# Patient Record
Sex: Female | Born: 1980 | Race: Black or African American | Hispanic: No | Marital: Single | Smoking: Never smoker
Health system: Southern US, Community
[De-identification: ages and names within clinical notes are randomized; demographics above are authoritative.]

## PROBLEM LIST (undated history)

## (undated) DIAGNOSIS — D219 Benign neoplasm of connective and other soft tissue, unspecified: Secondary | ICD-10-CM

---

## 2003-10-12 ENCOUNTER — Emergency Department (HOSPITAL_COMMUNITY): Admission: EM | Admit: 2003-10-12 | Discharge: 2003-10-12 | Payer: Self-pay | Admitting: Emergency Medicine

## 2004-06-07 ENCOUNTER — Emergency Department (HOSPITAL_COMMUNITY): Admission: EM | Admit: 2004-06-07 | Discharge: 2004-06-08 | Payer: Self-pay | Admitting: Emergency Medicine

## 2005-04-14 ENCOUNTER — Emergency Department (HOSPITAL_COMMUNITY): Admission: EM | Admit: 2005-04-14 | Discharge: 2005-04-14 | Payer: Self-pay | Admitting: Emergency Medicine

## 2006-02-20 ENCOUNTER — Emergency Department (HOSPITAL_COMMUNITY): Admission: EM | Admit: 2006-02-20 | Discharge: 2006-02-20 | Payer: Self-pay | Admitting: Emergency Medicine

## 2007-12-28 ENCOUNTER — Emergency Department (HOSPITAL_COMMUNITY): Admission: EM | Admit: 2007-12-28 | Discharge: 2007-12-28 | Payer: Self-pay | Admitting: Emergency Medicine

## 2009-10-07 ENCOUNTER — Emergency Department (HOSPITAL_COMMUNITY): Admission: EM | Admit: 2009-10-07 | Discharge: 2009-10-07 | Payer: Self-pay | Admitting: Emergency Medicine

## 2011-01-21 ENCOUNTER — Other Ambulatory Visit: Payer: Self-pay | Admitting: Family Medicine

## 2011-01-21 DIAGNOSIS — N6320 Unspecified lump in the left breast, unspecified quadrant: Secondary | ICD-10-CM

## 2011-01-29 ENCOUNTER — Other Ambulatory Visit: Payer: Self-pay | Admitting: Diagnostic Radiology

## 2011-01-29 ENCOUNTER — Other Ambulatory Visit: Payer: Self-pay | Admitting: Family Medicine

## 2011-01-29 ENCOUNTER — Ambulatory Visit
Admission: RE | Admit: 2011-01-29 | Discharge: 2011-01-29 | Disposition: A | Discharge status: Home / Self Care | Payer: Self-pay | Source: Ambulatory Visit | Attending: Family Medicine | Admitting: Family Medicine

## 2011-01-29 DIAGNOSIS — N6320 Unspecified lump in the left breast, unspecified quadrant: Secondary | ICD-10-CM

## 2011-05-27 LAB — CBC
Hemoglobin: 9.5 — ABNORMAL LOW
MCHC: 31.4
MCV: 71.1 — ABNORMAL LOW
Platelets: 340
RDW: 17.4 — ABNORMAL HIGH
WBC: 5.4

## 2011-05-27 LAB — URINALYSIS, ROUTINE W REFLEX MICROSCOPIC
Glucose, UA: NEGATIVE
Ketones, ur: NEGATIVE
Protein, ur: NEGATIVE
pH: 8

## 2011-05-27 LAB — POCT I-STAT, CHEM 8
Calcium, Ion: 1.05 — ABNORMAL LOW
HCT: 32 — ABNORMAL LOW
Hemoglobin: 10.9 — ABNORMAL LOW
Potassium: 3.9
TCO2: 23

## 2011-05-27 LAB — DIFFERENTIAL
Basophils Relative: 1
Lymphocytes Relative: 15
Monocytes Relative: 12

## 2011-06-02 ENCOUNTER — Emergency Department (HOSPITAL_COMMUNITY)
Admission: EM | Admit: 2011-06-02 | Discharge: 2011-06-03 | Disposition: A | Discharge status: Home / Self Care | Payer: Self-pay | Attending: Emergency Medicine | Admitting: Emergency Medicine

## 2011-06-02 DIAGNOSIS — R112 Nausea with vomiting, unspecified: Secondary | ICD-10-CM | POA: Insufficient documentation

## 2011-06-02 DIAGNOSIS — N949 Unspecified condition associated with female genital organs and menstrual cycle: Secondary | ICD-10-CM | POA: Insufficient documentation

## 2011-06-02 DIAGNOSIS — D259 Leiomyoma of uterus, unspecified: Secondary | ICD-10-CM | POA: Insufficient documentation

## 2011-06-03 ENCOUNTER — Emergency Department (HOSPITAL_COMMUNITY): Payer: Self-pay

## 2011-06-03 LAB — POCT PREGNANCY, URINE: Preg Test, Ur: NEGATIVE

## 2011-06-03 LAB — URINALYSIS, ROUTINE W REFLEX MICROSCOPIC
Bilirubin Urine: NEGATIVE
Ketones, ur: 40 mg/dL — AB
Protein, ur: 30 mg/dL — AB
pH: 5.5 (ref 5.0–8.0)

## 2011-06-03 LAB — URINE MICROSCOPIC-ADD ON

## 2011-06-03 LAB — COMPREHENSIVE METABOLIC PANEL
Alkaline Phosphatase: 50 U/L (ref 39–117)
CO2: 27 mEq/L (ref 19–32)
Calcium: 9.7 mg/dL (ref 8.4–10.5)
Chloride: 101 mEq/L (ref 96–112)
Creatinine, Ser: <0.47 mg/dL — ABNORMAL LOW (ref 0.50–1.10)
Glucose, Bld: 92 mg/dL (ref 70–99)
Sodium: 136 mEq/L (ref 135–145)

## 2011-06-03 LAB — WET PREP, GENITAL: Clue Cells Wet Prep HPF POC: NONE SEEN

## 2011-06-03 LAB — DIFFERENTIAL
Basophils Relative: 1 % (ref 0–1)
Eosinophils Absolute: 0 10*3/uL (ref 0.0–0.7)
Neutro Abs: 11 10*3/uL — ABNORMAL HIGH (ref 1.7–7.7)

## 2011-06-03 LAB — CBC
HCT: 28.7 % — ABNORMAL LOW (ref 36.0–46.0)
Hemoglobin: 8.3 g/dL — ABNORMAL LOW (ref 12.0–15.0)
RBC: 4.58 MIL/uL (ref 3.87–5.11)
RDW: 18.1 % — ABNORMAL HIGH (ref 11.5–15.5)
WBC: 12.8 10*3/uL — ABNORMAL HIGH (ref 4.0–10.5)

## 2011-06-05 ENCOUNTER — Encounter: Payer: Self-pay | Admitting: Advanced Practice Midwife

## 2011-07-11 ENCOUNTER — Ambulatory Visit (INDEPENDENT_AMBULATORY_CARE_PROVIDER_SITE_OTHER): Payer: Self-pay | Admitting: Advanced Practice Midwife

## 2011-07-11 VITALS — BP 109/61 | HR 80 | Ht 59.0 in | Wt 91.2 lb

## 2011-07-11 DIAGNOSIS — D500 Iron deficiency anemia secondary to blood loss (chronic): Secondary | ICD-10-CM | POA: Insufficient documentation

## 2011-07-11 DIAGNOSIS — D219 Benign neoplasm of connective and other soft tissue, unspecified: Secondary | ICD-10-CM | POA: Insufficient documentation

## 2011-07-11 DIAGNOSIS — D259 Leiomyoma of uterus, unspecified: Secondary | ICD-10-CM

## 2011-07-11 NOTE — Patient Instructions (Signed)
Fibroids You have been diagnosed as having a fibroid. Fibroids are smooth muscle lumps (tumors) which can occur any place in a woman's body. They are usually in the womb (uterus). The most common problem (symptom) of fibroids is bleeding. Over time this may cause low red blood cells (anemia). Other symptoms include feelings of pressure and pain in the pelvis. The diagnosis (learning what is wrong) of fibroids is made by physical exam. Sometimes tests such as an ultrasound are used. This is helpful when fibroids are felt around the ovaries and to look for tumors. TREATMENT   Most fibroids do not need surgical or medical treatment. Sometimes a tissue sample (biopsy) of the lining of the uterus is done to rule out cancer. If there is no cancer and only a small amount of bleeding, the problem can be watched.   Hormonal treatment can improve the problem.   When surgery is needed, it can consist of removing the fibroid. Vaginal birth may not be possible after the removal of fibroids. This depends on where they are and the extent of surgery. When pregnancy occurs with fibroids it is usually normal.   Your caregiver can help decide which treatments are best for you.  HOME CARE INSTRUCTIONS   Do not use aspirin as this may increase bleeding problems.   If your periods (menses) are heavy, record the number of pads or tampons used per month. Bring this information to your caregiver. This can help them determine the best treatment for you.  SEEK IMMEDIATE MEDICAL CARE IF:  You have pelvic pain or cramps not controlled with medications, or experience a sudden increase in pain.   You have an increase of pelvic bleeding between and during menses.   You feel lightheaded or have fainting spells.   You develop worsening belly (abdominal) pain.  Document Released: 08/15/2000 Document Revised: 04/30/2011 Document Reviewed: 04/06/2008 [employer] Patient Information 2012 [employer]. 

## 2011-07-11 NOTE — Assessment & Plan Note (Signed)
Fibroids. Causing menometrorrhagia and anemia.  Last cbc 8.3 Currently on Depo with controlled periods. Patient main concern is fertility. Explained risks of surgery on fertility and uterine integrity. Patient wants to talk to [facility] surgeon about Fibroid removal/ablation.

## 2011-07-11 NOTE — Progress Notes (Signed)
  Subjective:    Patient ID: Yesenia Holmes, female    DOB: [birth year], 30 y.o.   MRN: [id number]  HPI 1. Fibroids/Prolonged vaginal bleeding during menses/worry for fertility with fibroids. Patient is a 30 y/o aaf with fibroids diagnosed on U/S one month ago at [facility]. Also seen at [facility] department and noted to have an enlarged uterus on bimanual. Patient c/o worry about infertility with fibroids. She was placed on Depo for heavy prolonged menses causing blood loss anemia.  Last cbc oct 1st:    Component Value Date/Time   WBC 12.8* 06/02/2011 2324   HGB 8.3* 06/02/2011 2324   HCT 28.7* 06/02/2011 2324   PLT 322 06/02/2011 2324   MCV 62.7* 06/02/2011 2324   NEUTROABS 11.0* 06/02/2011 2324   LYMPHSABS 0.8 06/02/2011 2324   MONOABS 0.8 06/02/2011 2324   EOSABS 0.0 06/02/2011 2324   BASOSABS 0.1 06/02/2011 2324  Patient reports having shorter less severe periods on Depo.   U/S Oct 2nd:  *RADIOLOGY REPORT*  Clinical Data: Pelvic pain  TRANSABDOMINAL AND TRANSVAGINAL ULTRASOUND OF PELVIS  Technique: Both transabdominal and transvaginal ultrasound  examinations of the pelvis were performed. Transabdominal technique  was performed for global imaging of the pelvis including uterus,  ovaries, adnexal regions, and pelvic cul-de-sac.  Comparison: None.  It was necessary to proceed with endovaginal exam following the  transabdominal exam to visualize the uterus, endometrium, and  cervix.  Findings:  Uterus: The uterus measures 9.6 x 4.5 x 6.3 cm. Heterogeneous  nodular myometrial echotexture consistent with uterine fibroids.  The largest is measured at about 3.8 x 3.6 x 3.9 cm.  Endometrium: The endometrium is displaced by the uterine fibroids  and is not visualized.  Right ovary: The right ovary measures 3.2 x 2.0 x 1.4 cm and  contains normal follicular changes. No abnormal adnexal masses.  Left ovary: The left ovary measures 2.8 x 1.6 x 2.0 cm. Normal  follicular changes. No abnormal  adnexal masses.  Other findings: Small amount of free fluid in the cul-de-sac.  IMPRESSION:  Multiple diffuse myometrial masses measuring up to about 3.9 cm  maximal diameter, consistent with uterine fibroids. Due to the  fibroids, the endometrium is not visualized. Normal appearance of  the ovaries. Small amount of free fluid, likely physiologic.  Review of Systems No fever, weight loss, chills, night sweats. No dysuria, vaginal discharge, no flank pain.    Objective:   Physical Exam  Abdominal: Soft. She exhibits no distension and no mass. There is no tenderness.  Genitourinary: There is no rash or tenderness on the right labia. There is no rash or tenderness on the left labia. Uterus is not tender. Cervix exhibits no motion tenderness. Right adnexum displays no mass, no tenderness and no fullness. Left adnexum displays no mass, no tenderness and no fullness. There is bleeding around the vagina. No erythema or tenderness around the vagina. No vaginal discharge found.       Uterus 8 week size with marble like fibroids palpated.   Filed Vitals:   07/11/11 0918  BP: 109/61  Pulse: 80  Height: 4\' 11"  (1.499 m)  Weight: 91 lb 3.2 oz (41.368 kg)      Assessment & Plan:  Fibroids. Causing menometrorrhagia and anemia.  Last cbc 8.3 Currently on Depo with controlled periods. Patient main concern is fertility. Explained risks of surgery on fertility and uterine integrity. Patient wants to talk to [facility] surgeon about Fibroid removal/ablation.

## 2011-08-28 ENCOUNTER — Ambulatory Visit: Payer: Self-pay | Admitting: Obstetrics and Gynecology

## 2012-06-13 IMAGING — US US TRANSVAGINAL NON-OB
1 series · 13 of 25 positions shown · non-contrast
Comparison: None.

CLINICAL DATA: Pelvic pain

TRANSABDOMINAL AND TRANSVAGINAL ULTRASOUND OF PELVIS
TECHNIQUE: Both transabdominal and transvaginal ultrasound
examinations of the pelvis were performed. Transabdominal technique
was performed for global imaging of the pelvis including uterus,
ovaries, adnexal regions, and pelvic cul-de-sac.

[Series 1: us transvaginal non-ob · 0.26mm/px · 13 of 39 slices shown]
[im 1/39]
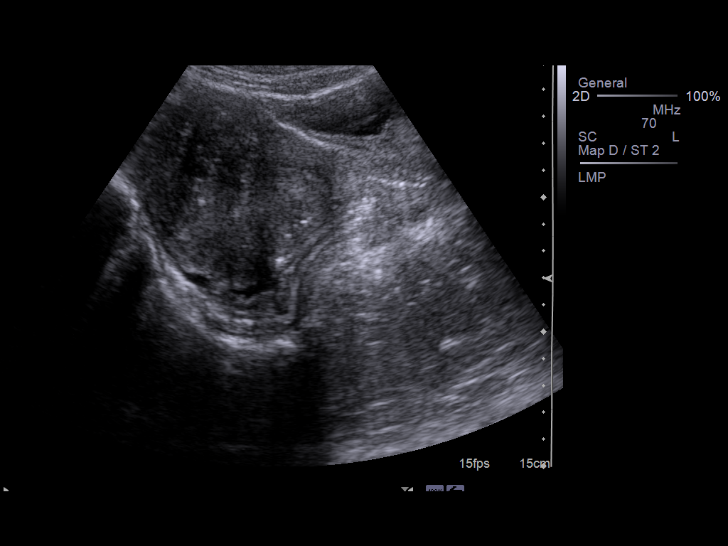
[im 4/39]
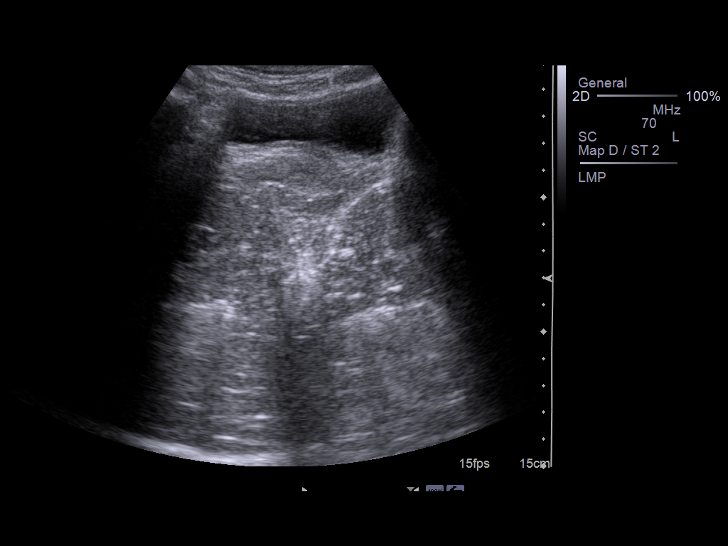
[im 7/39]
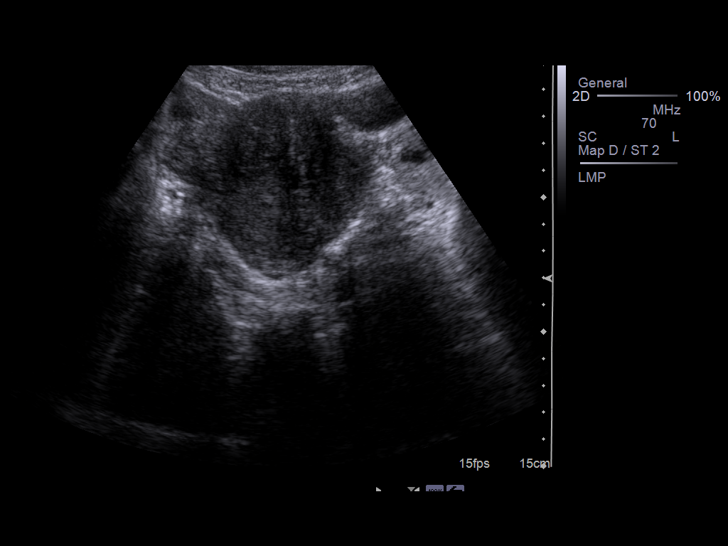
[im 10/39]
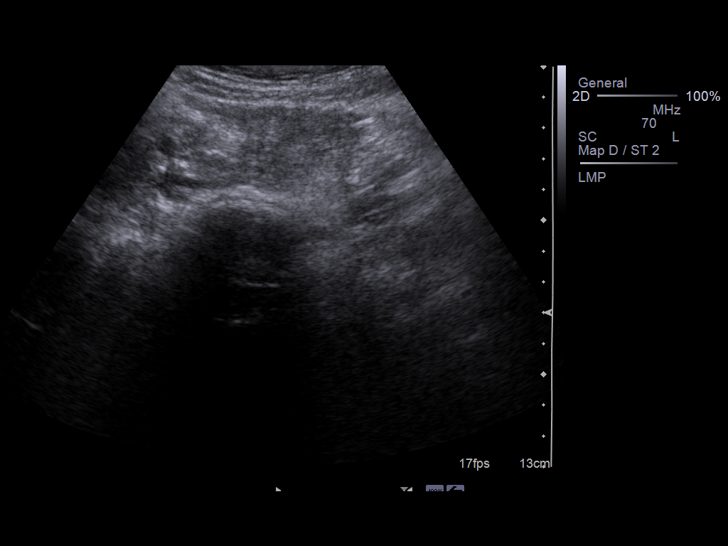
[im 13/39]
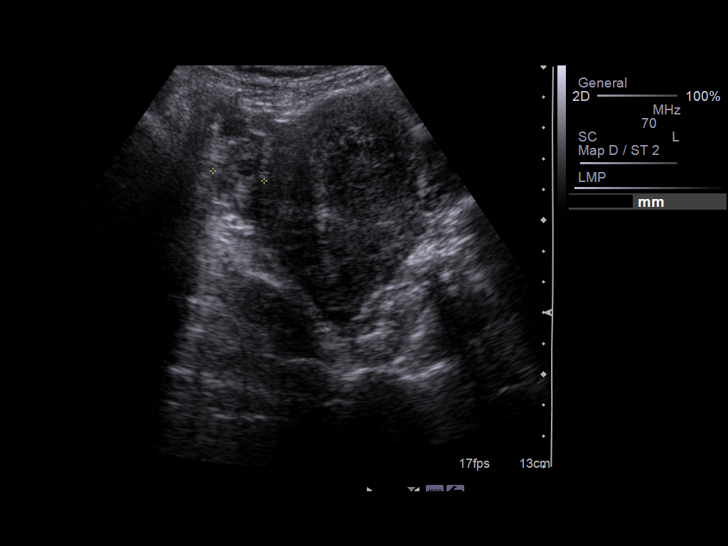
[im 16/39]
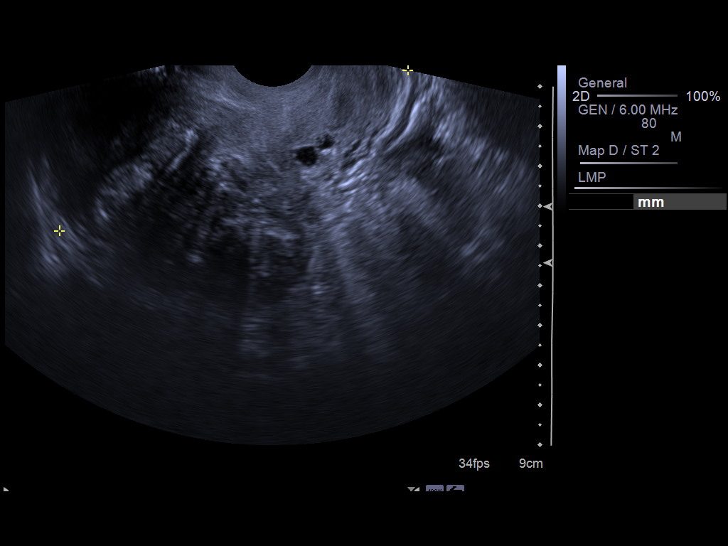
[im 20/39]
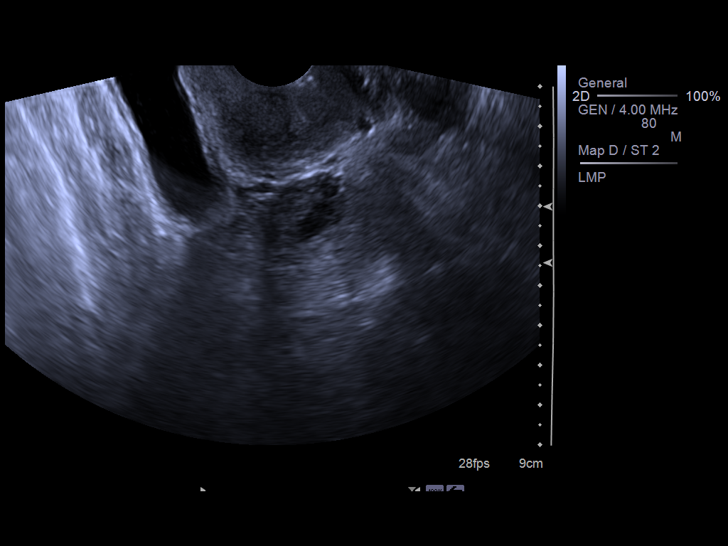
[im 23/39]
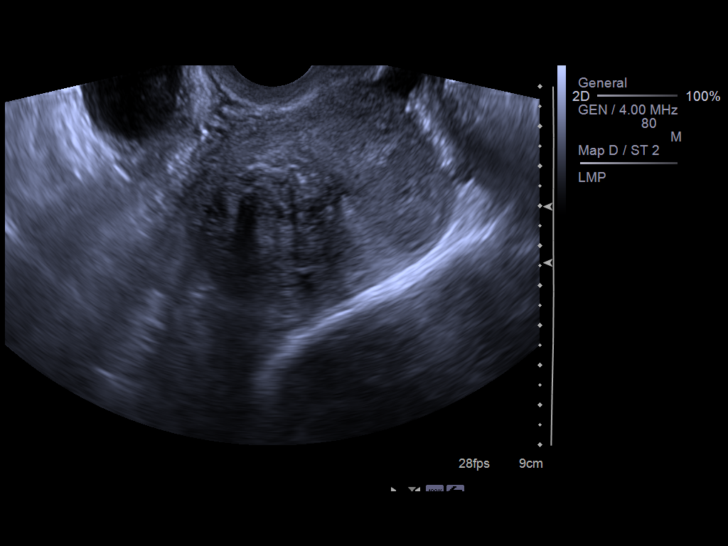
[im 26/39]
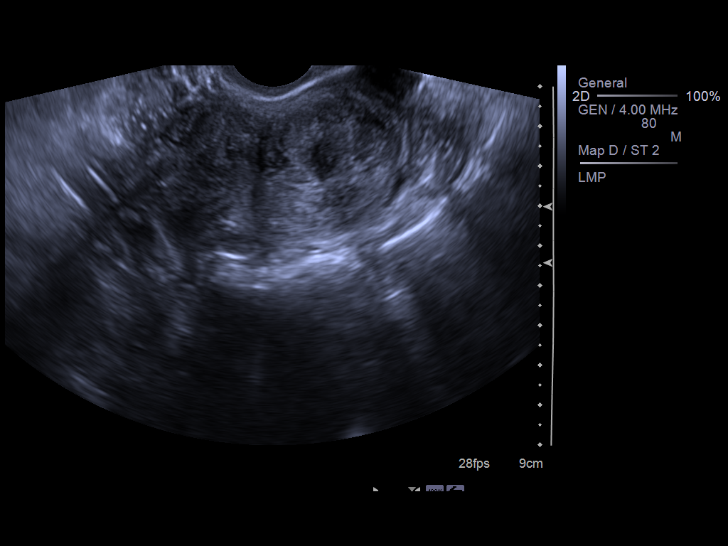
[im 29/39]
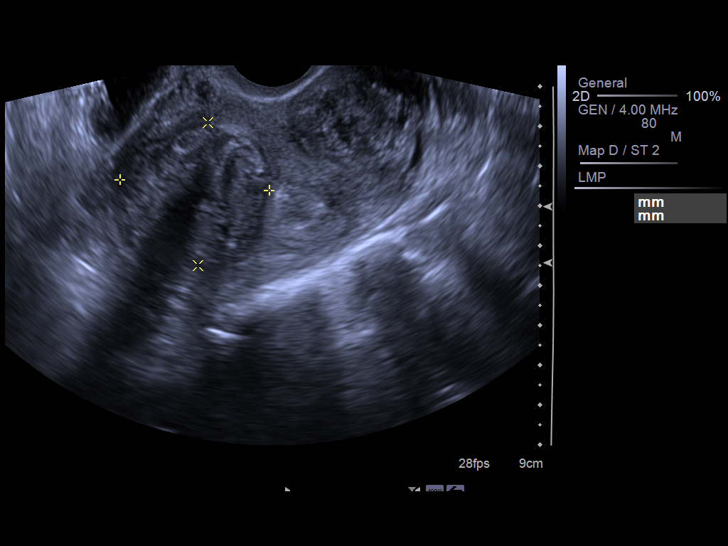
[im 32/39]
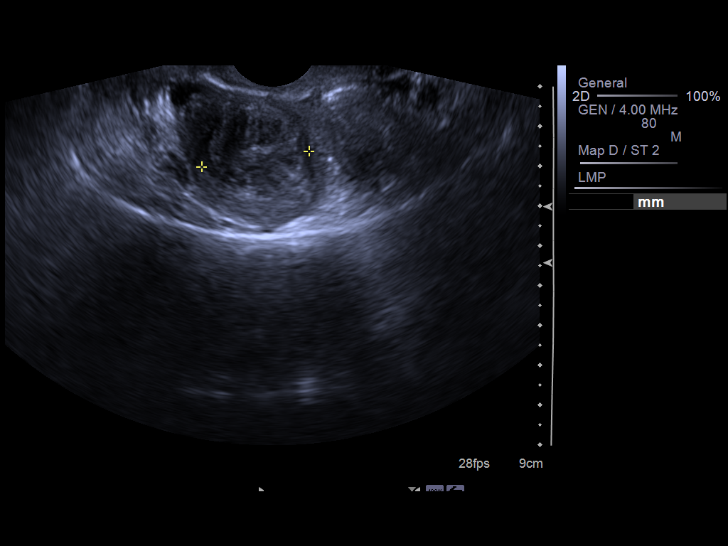
[im 35/39]
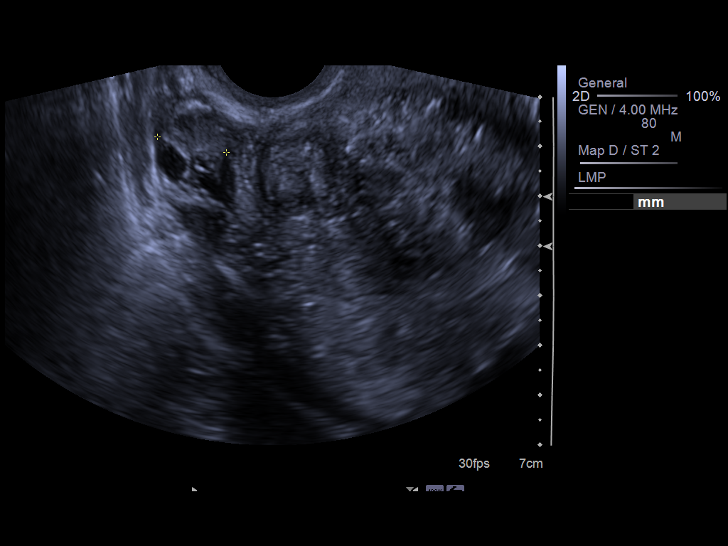
[im 39/39]
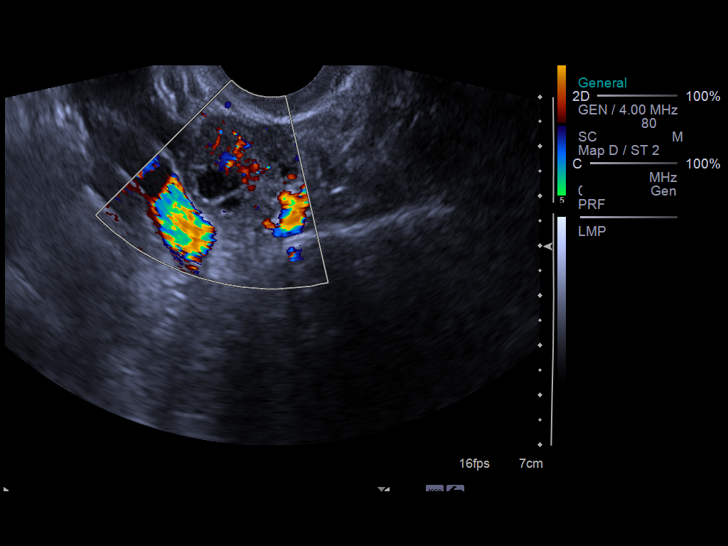

[13 of 25 positions shown; findings below may reference images not displayed]

It was necessary to proceed with endovaginal exam following the
transabdominal exam to visualize the uterus, endometrium, and
cervix.
FINDINGS: Uterus: The uterus measures 9.6 x 4.5 x 6.3 cm.  Heterogeneous
nodular myometrial echotexture consistent with uterine fibroids.
The largest is measured at about 3.8 x 3.6 x 3.9 cm.

Endometrium: The endometrium is displaced by the uterine fibroids
and is not visualized.

Right ovary:  The right ovary measures 3.2 x 2.0 x 1.4 cm and
contains normal follicular changes.  No abnormal adnexal masses.

Left ovary: The left ovary measures 2.8 x 1.6 x 2.0 cm.  Normal
follicular changes.  No abnormal adnexal masses.

Other findings: Small amount of free fluid in the cul-de-sac.
IMPRESSION: Multiple diffuse myometrial masses measuring up to about 3.9 cm
maximal diameter, consistent with uterine fibroids.  Due to the
fibroids, the endometrium is not visualized.  Normal appearance of
the ovaries.  Small amount of free fluid, likely physiologic.

## 2012-09-27 ENCOUNTER — Encounter: Payer: Self-pay | Admitting: Obstetrics & Gynecology

## 2012-09-27 ENCOUNTER — Ambulatory Visit (INDEPENDENT_AMBULATORY_CARE_PROVIDER_SITE_OTHER): Payer: Self-pay | Admitting: Obstetrics & Gynecology

## 2012-09-27 VITALS — BP 108/67 | HR 79 | Temp 98.9°F | Ht 60.0 in | Wt 92.1 lb

## 2012-09-27 DIAGNOSIS — D259 Leiomyoma of uterus, unspecified: Secondary | ICD-10-CM

## 2012-09-27 DIAGNOSIS — N920 Excessive and frequent menstruation with regular cycle: Secondary | ICD-10-CM

## 2012-09-27 DIAGNOSIS — D219 Benign neoplasm of connective and other soft tissue, unspecified: Secondary | ICD-10-CM

## 2012-09-27 NOTE — Progress Notes (Signed)
  Subjective:    Patient ID: Yesenia Holmes, female    DOB: [birth year], 32 y.o.   MRN: [id number]  HPI 32 yo S AA G1P0A1 with known fibroids with a hbg of 8.3. She says that her periods lasts 20 days per month. She got a depo provera injection 11/13 but says that this did not help at all.  Review of Systems She declines a Gardasil and flu vaccine.    Objective:   Physical Exam        Assessment & Plan:  Fibroids and anemia- I will check a TSH. I have discussed a hysterectomy as well as trying a Mirena IUD. She would like to try the Mirena and will fill out the application for the free Mirena.  Rec iron BID.

## 2012-09-28 LAB — TSH: TSH: 0.856 u[IU]/mL (ref 0.350–4.500)

## 2012-10-18 ENCOUNTER — Encounter: Payer: Self-pay | Admitting: Medical

## 2012-10-18 ENCOUNTER — Other Ambulatory Visit: Payer: Self-pay | Admitting: Medical

## 2012-10-18 ENCOUNTER — Ambulatory Visit (INDEPENDENT_AMBULATORY_CARE_PROVIDER_SITE_OTHER): Payer: Self-pay | Admitting: Medical

## 2012-10-18 VITALS — BP 109/64 | Temp 97.0°F | Ht 60.0 in | Wt 92.4 lb

## 2012-10-18 DIAGNOSIS — Z975 Presence of (intrauterine) contraceptive device: Secondary | ICD-10-CM

## 2012-10-18 DIAGNOSIS — N920 Excessive and frequent menstruation with regular cycle: Secondary | ICD-10-CM

## 2012-10-18 DIAGNOSIS — Z30430 Encounter for insertion of intrauterine contraceptive device: Secondary | ICD-10-CM

## 2012-10-18 MED ORDER — LEVONORGESTREL 20 MCG/24HR IU IUD
INTRAUTERINE_SYSTEM | Freq: Once | INTRAUTERINE | Status: AC
  Administered 2012-10-18: 1 via INTRAUTERINE

## 2012-10-18 NOTE — Progress Notes (Signed)
Patient ID: Yesenia Holmes, female   DOB: 1981/03/13, 32 y.o.   MRN: [id number]  History:  Ms. Yesenia Holmes is a 32 y.o. G1P0010 who presents to clinic today for IUD insertion. The patient has known fibroids and menorrhagia. She has discussed all options for bleeding control at her last visit and decided on Mirena IUD.    The following portions of the patient's history were reviewed and updated as appropriate: allergies, current medications, past family history, past medical history, past social history, past surgical history and problem list.  Review of Systems:  Pertinent items are noted in HPI.  Objective:  Physical Exam BP 109/64  Temp(Src) 97 F (36.1 C) (Oral)  Ht 5' (1.524 m)  Wt 92 lb 6.4 oz (41.912 kg)  BMI 18.05 kg/m2  LMP 09/16/2012 GENERAL: Well-developed, well-nourished female in no acute distress.  HEENT: Normocephalic, atraumatic.   LUNGS: Normal rate. Clear to auscultation bilaterally.  HEART: Regular rate and rhythm with no adventitious sounds.  ABDOMEN: Soft, nontender, nondistended. No organomegaly. Normal bowel sounds appreciated in all quadrants.  PELVIC: Normal external female genitalia. Vagina is pink and rugated.  Normal discharge. Normal cervix contour. Uterus is normal in size. No adnexal mass or tenderness.  EXTREMITIES: No cyanosis, clubbing, or edema.  Labs and Imaging UPT - negative  IUD Insertion Procedure Note  Pre-operative Diagnosis: menorrhage  Post-operative Diagnosis: normal Indications: contraception, menorrhagia  Procedure Details  Urine pregnancy test was done and result was negative.  The risks (including infection, bleeding, pain, and uterine perforation) and benefits of the procedure were explained to the patient and Written informed consent was obtained.    Cervix cleansed with Betadine. Uterus sounded to 7 cm. Initial resistance met at the internal os. Used dilators #1 and #3 used to dilate cervix. IUD inserted without difficulty  after dilation. String visible and trimmed. Patient tolerated procedure well.  IUD Information: Mirena.  Condition: Stable  Complications: None  Plan:  The patient was advised to call for any fever or for prolonged or severe pain or bleeding. She was advised to use NSAID as needed for mild to moderate pain.   Assessment & Plan:  Assessment: IUD insertion  Plans: Post procedure care instructions given Patient may take NSAIDs for pain control today Patient will return to clinic in 4 weeks for a string check or sooner if needed

## 2012-10-18 NOTE — Patient Instructions (Addendum)
Intrauterine Device Insertion Care After Refer to this sheet in the next few weeks. These instructions provide you with information on caring for yourself after your procedure. Your caregiver may also give you more specific instructions. Your treatment has been planned according to current medical practices, but problems sometimes occur. Call your caregiver if you have any problems or questions after your procedure. HOME CARE INSTRUCTIONS   Only take over-the-counter or prescription medicines for pain, discomfort, or fever as directed by your caregiver. Do not use aspirin. This may increase bleeding.  Check your IUD to make sure it is in place before you resume sexual activity. You should be able to feel the strings. If you cannot feel the strings, something may be wrong. The IUD may have fallen out of the uterus, or the uterus may have been punctured (perforated) during placement. Also, if the strings are getting longer, it may mean that the IUD is being forced out of the uterus. You no longer have full protection from pregnancy if any of these problems occur.  You may resume sexual intercourse if you are not having problems with the IUD. The IUD is considered immediately effective.  You may resume normal activities.  Keep all follow-up appointments to be sure your IUD has remained in place. After the first exam, yearly exams are advised, unless you cannot feel the strings of your IUD.  Continue to check that the IUD is still in place by feeling for the strings after every menstrual period. SEEK MEDICAL CARE IF:   You have bleeding that is heavier or lasts longer than a normal menstrual cycle.  You have a fever.  You have increasing cramps or abdominal pain not relieved with medicine.  You have abdominal pain that does not seem to be related to the same area of earlier cramping and pain.  You are lightheaded, unusually weak, or faint.  You have abnormal vaginal discharge or  smells.  You have pain during sexual intercourse.  You cannot feel the IUD strings, or the IUD string has gotten longer.  You feel the IUD at the opening of the cervix in the vagina.  You think you are pregnant, or you miss your menstrual period.  The IUD string is hurting your sex partner. Document Released: 04/16/2011 Document Revised: 11/10/2011 Document Reviewed: 04/16/2011 [facility] Patient Information 2013 [place], [place]. Levonorgestrel intrauterine device (IUD) What is this medicine? LEVONORGESTREL IUD (LEE voe nor jes trel) is a contraceptive (birth control) device. It is used to prevent pregnancy and to treat heavy bleeding that occurs during your period. It can be used for up to 5 years. This medicine may be used for other purposes; ask your health care provider or pharmacist if you have questions. What should I tell my health care provider before I take this medicine? They need to know if you have any of these conditions: -abnormal Pap smear -cancer of the breast, uterus, or cervix -diabetes -endometritis -genital or pelvic infection now or in the past -have more than one sexual partner or your partner has more than one partner -heart disease -history of an ectopic or tubal pregnancy -immune system problems -IUD in place -liver disease or tumor -problems with blood clots or take blood-thinners -use intravenous drugs -uterus of unusual shape -vaginal bleeding that has not been explained -an unusual or allergic reaction to levonorgestrel, other hormones, silicone, or polyethylene, medicines, foods, dyes, or preservatives -pregnant or trying to get pregnant -breast-feeding How should I use this medicine? This device is  placed inside the uterus by a health care professional. Talk to your pediatrician regarding the use of this medicine in children. Special care may be needed. Overdosage: If you think you have taken too much of this medicine contact a poison control  center or emergency room at once. NOTE: This medicine is only for you. Do not share this medicine with others. What if I miss a dose? This does not apply. What may interact with this medicine? Do not take this medicine with any of the following medications: -amprenavir -bosentan -fosamprenavir This medicine may also interact with the following medications: -aprepitant -barbiturate medicines for inducing sleep or treating seizures -bexarotene -griseofulvin -medicines to treat seizures like carbamazepine, ethotoin, felbamate, oxcarbazepine, phenytoin, topiramate -modafinil -pioglitazone -rifabutin -rifampin -rifapentine -some medicines to treat HIV infection like atazanavir, indinavir, lopinavir, nelfinavir, tipranavir, ritonavir -St. John's wort -warfarin This list may not describe all possible interactions. Give your health care provider a list of all the medicines, herbs, non-prescription drugs, or dietary supplements you use. Also tell them if you smoke, drink alcohol, or use illegal drugs. Some items may interact with your medicine. What should I watch for while using this medicine? Visit your doctor or health care professional for regular check ups. See your doctor if you or your partner has sexual contact with others, becomes HIV positive, or gets a sexual transmitted disease. This product does not protect you against HIV infection (AIDS) or other sexually transmitted diseases. You can check the placement of the IUD yourself by reaching up to the top of your vagina with clean fingers to feel the threads. Do not pull on the threads. It is a good habit to check placement after each menstrual period. Call your doctor right away if you feel more of the IUD than just the threads or if you cannot feel the threads at all. The IUD may come out by itself. You may become pregnant if the device comes out. If you notice that the IUD has come out use a backup birth control method like condoms and  call your health care provider. Using tampons will not change the position of the IUD and are okay to use during your period. What side effects may I notice from receiving this medicine? Side effects that you should report to your doctor or health care professional as soon as possible: -allergic reactions like skin rash, itching or hives, swelling of the face, lips, or tongue -fever, flu-like symptoms -genital sores -high blood pressure -no menstrual period for 6 weeks during use -pain, swelling, warmth in the leg -pelvic pain or tenderness -severe or sudden headache -signs of pregnancy -stomach cramping -sudden shortness of breath -trouble with balance, talking, or walking -unusual vaginal bleeding, discharge -yellowing of the eyes or skin Side effects that usually do not require medical attention (report to your doctor or health care professional if they continue or are bothersome): -acne -breast pain -change in sex drive or performance -changes in weight -cramping, dizziness, or faintness while the device is being inserted -headache -irregular menstrual bleeding within first 3 to 6 months of use -nausea This list may not describe all possible side effects. Call your doctor for medical advice about side effects. You may report side effects to FDA at 1-800-FDA-1088. Where should I keep my medicine? This does not apply. NOTE: This sheet is a summary. It may not cover all possible information. If you have questions about this medicine, talk to your doctor, pharmacist, or health care provider.  2012, [employer]. (  09/08/2008 6:39:08 PM)

## 2012-11-18 ENCOUNTER — Ambulatory Visit: Payer: Self-pay | Admitting: Family

## 2013-01-26 ENCOUNTER — Other Ambulatory Visit: Payer: Self-pay

## 2014-02-22 ENCOUNTER — Emergency Department (HOSPITAL_COMMUNITY)
Admission: EM | Admit: 2014-02-22 | Discharge: 2014-02-23 | Disposition: A | Discharge status: Home / Self Care | Payer: No Typology Code available for payment source | Attending: Emergency Medicine | Admitting: Emergency Medicine

## 2014-02-22 ENCOUNTER — Encounter (HOSPITAL_COMMUNITY): Payer: Self-pay | Admitting: Emergency Medicine

## 2014-02-22 DIAGNOSIS — K029 Dental caries, unspecified: Secondary | ICD-10-CM | POA: Insufficient documentation

## 2014-02-22 DIAGNOSIS — K0510 Chronic gingivitis, plaque induced: Secondary | ICD-10-CM | POA: Insufficient documentation

## 2014-02-22 NOTE — ED Notes (Signed)
Pt c/o pain to upper molar area on L side of mouth. Symptoms since this AM. Pt has taken Motrin, Tylenol for pain. Pt states she does not have a dentist and would like a referral.

## 2014-02-23 MED ORDER — TRAMADOL HCL 50 MG PO TABS: 50.0000 mg | ORAL_TABLET | Freq: Four times a day (QID) | ORAL | Status: DC | PRN

## 2014-02-23 MED ORDER — AMOXICILLIN 500 MG PO CAPS: 500.0000 mg | ORAL_CAPSULE | Freq: Three times a day (TID) | ORAL | Status: DC

## 2014-02-23 NOTE — ED Provider Notes (Signed)
Medical screening examination/treatment/procedure(s) were performed by non-physician practitioner and as supervising physician I was immediately available for consultation/collaboration.   EKG Interpretation None       Threasa Beards, MD 02/23/14 [phone]

## 2014-02-23 NOTE — Discharge Instructions (Signed)
Please followup with the dentist tomorrow.    Dental Caries Dental caries is tooth decay. This decay can cause a hole in teeth (cavity) that can get bigger and deeper over time. HOME CARE  Brush and floss your teeth. Do this at least two times a day.  Use a fluoride toothpaste.  Use a mouth rinse if told by your dentist or doctor.  Eat less sugary and starchy foods. Drink less sugary drinks.  Avoid snacking often on sugary and starchy foods. Avoid sipping often on sugary drinks.  Keep regular checkups and cleanings with your dentist.  Use fluoride supplements if told by your dentist or doctor.  Allow fluoride to be applied to teeth if told by your dentist or doctor. MAKE SURE YOU:  Understand these instructions.  Will watch your condition.  Will get help right away if you are not doing well or get worse. Document Released: 05/27/2008 Document Revised: 04/20/2013 Document Reviewed: 08/20/2012 [facility] Patient Information 2015 [place], [place]. This information is not intended to replace advice given to you by your health care provider. Make sure you discuss any questions you have with your health care provider.   Dental Pain A tooth ache may be caused by cavities (tooth decay). Cavities expose the nerve of the tooth to air and hot or cold temperatures. It may come from an infection or abscess (also called a boil or furuncle) around your tooth. It is also often caused by dental caries (tooth decay). This causes the pain you are having. DIAGNOSIS  Your caregiver can diagnose this problem by exam. TREATMENT   If caused by an infection, it may be treated with medications which kill germs (antibiotics) and pain medications as prescribed by your caregiver. Take medications as directed.  Only take over-the-counter or prescription medicines for pain, discomfort, or fever as directed by your caregiver.  Whether the tooth ache today is caused by infection or dental disease, you should  see your dentist as soon as possible for further care. SEEK MEDICAL CARE IF: The exam and treatment you received today has been provided on an emergency basis only. This is not a substitute for complete medical or dental care. If your problem worsens or new problems (symptoms) appear, and you are unable to meet with your dentist, call or return to this location. SEEK IMMEDIATE MEDICAL CARE IF:   You have a fever.  You develop redness and swelling of your face, jaw, or neck.  You are unable to open your mouth.  You have severe pain uncontrolled by pain medicine. MAKE SURE YOU:   Understand these instructions.  Will watch your condition.  Will get help right away if you are not doing well or get worse. Document Released: 08/18/2005 Document Revised: 11/10/2011 Document Reviewed: 04/05/2008 [facility] Patient Information 2015 [place], [place]. This information is not intended to replace advice given to you by your health care provider. Make sure you discuss any questions you have with your health care provider.   Gingivitis  Gingivitis is an infection of the teeth and bones that support the teeth. Your gums become red, sore, and puffy (swollen). It is caused by germs that build up on your teeth and gums (plaque). HOME CARE  Floss and then brush your teeth.  Brush at least twice a day.  Floss at least once a day.  Avoid sugar between meals.  Do not drink juice before bed. Only drink water.  Make and keep your regular checkups and cleanings with your dentist.  Use  any mouth care product or toothpaste as told by your dentist. GET HELP RIGHT AWAY IF:  You have painful, red tissue around your teeth.  You have trouble chewing.  You have loose or infected teeth. MAKE SURE YOU:  Understand these instructions.  Will watch your condition.  Will get help right away if you are not doing well or get worse. Document Released: 09/20/2010 Document Revised: 11/10/2011 Document  Reviewed: 09/20/2010 [facility] Patient Information 2015 [place], [place]. This information is not intended to replace advice given to you by your health care provider. Make sure you discuss any questions you have with your health care provider.

## 2014-02-23 NOTE — ED Provider Notes (Signed)
CSN: [id number]     Arrival date & time 02/22/14  2238 History   First MD Initiated Contact with Patient 02/23/14 0310     Chief Complaint  Patient presents with  . Dental Pain   HPI  History provided by the patient. Patient is a 33 year old female who presents with complaints of left upper molar pain. Pains first began yesterday or in the morning waking her up around 3 AM. Patient reports that she took an ibuprofen which seemed to help with pain and she was able to rest for longer period she was able to go to work through the day but reports frequently returning pain. She tried taking taking medications this evening nothing has been helping. She reports having some small amounts of associated bleeding. Denies any other drainage. No swelling of the gums or the face. No fever, chills or sweats. No swelling of the tongue.    History reviewed. No pertinent past medical history. History reviewed. No pertinent past surgical history. Family History  Problem Relation Age of Onset  . Fibroids Mother    History  Substance Use Topics  . Smoking status: Never Smoker   . Smokeless tobacco: Never Used  . Alcohol Use: No   OB History   Grav Para Term Preterm Abortions TAB SAB Ect Mult Living   1    1 1     0     Review of Systems  Constitutional: Negative for fever, chills and diaphoresis.  HENT: Negative for facial swelling.   All other systems reviewed and are negative.     Allergies  Tomato  Home Medications   Prior to Admission medications   Medication Sig Start Date End Date Taking? Authorizing Provider  acetaminophen (TYLENOL) 500 MG tablet Take 1,000 mg by mouth every 4 (four) hours as needed for mild pain.    Yes Historical Provider, MD  Prenatal Vit-Fe Fumarate-FA (PRENATAL MULTIVITAMIN) TABS tablet Take 1 tablet by mouth daily at 12 noon.   Yes Historical Provider, MD   BP 135/83  Pulse 84  Temp(Src) 99.1 F (37.3 C) (Oral)  Resp 16  SpO2 99% Physical Exam  Nursing  note and vitals reviewed. Constitutional: She is oriented to person, place, and time. She appears well-developed and well-nourished. No distress.  HENT:  Head: Normocephalic.  Mouth/Throat:    There is some mild erythema and friability to the gums around the left upper first and second molar teeth. There is a old appearing fracture indicating of the left upper second molar. No significant surrounding swelling of the gums. No signs of significant dental abscess. No asymmetry of the face.  Neck: Normal range of motion. Neck supple.  Cardiovascular: Normal rate and regular rhythm.   Pulmonary/Chest: Breath sounds normal. No respiratory distress. She has no wheezes. She has no rales.  Abdominal: Soft.  Lymphadenopathy:    She has no cervical adenopathy.  Neurological: She is alert and oriented to person, place, and time.  Skin: Skin is warm and dry. No rash noted.  Psychiatric: She has a normal mood and affect. Her behavior is normal.    ED Course  Procedures   COORDINATION OF CARE:  Nursing notes reviewed. Vital signs reviewed. Initial pt interview and examination performed.   Filed Vitals:   02/22/14 2305  BP: 135/83  Pulse: 84  Temp: 99.1 F (37.3 C)  TempSrc: Oral  Resp: 16  SpO2: 99%    3:23 AM-patient seen and evaluated. Patient appears in some discomfort. Does no appear in  acute distress. No signs of concerning dental abscess. No fever.   Patient tolerated dental block well. He reports good improvement of pain. We'll give prescription for antibiotic and dental referral.    Dental Block Performed by: Martie Lee Authorized by: Martie Lee Consent: Verbal consent obtained. Risks and benefits: risks, benefits and alternatives were discussed Consent given by: patient Patient identity confirmed: provided demographic data  Location: left upper second molar  Local anesthetic: Bupivacaine 0.5% with epinephrine  Anesthetic total: 1.8 ml  Irrigation method:  syringe  Patient tolerance: Patient tolerated the procedure well with no immediate complications. Pain improved.     MDM   Final diagnoses:  Pain due to dental caries  Gingivitis        Martie Lee, PA-C 02/23/14 [phone]

## 2014-07-03 ENCOUNTER — Encounter (HOSPITAL_COMMUNITY): Payer: Self-pay | Admitting: Emergency Medicine

## 2015-12-31 ENCOUNTER — Ambulatory Visit (INDEPENDENT_AMBULATORY_CARE_PROVIDER_SITE_OTHER): Payer: BLUE CROSS/BLUE SHIELD | Admitting: Obstetrics and Gynecology

## 2015-12-31 DIAGNOSIS — Z30431 Encounter for routine checking of intrauterine contraceptive device: Secondary | ICD-10-CM

## 2015-12-31 NOTE — Progress Notes (Signed)
Patient ID: Yesenia Holmes, female   DOB: [birth year], 35 y.o.   MRN: [id number] 35 yo presenting today for IUD removal. Patient has had the IUD in place since 10/18/2012. She thought it had been 5 years since insertion. Patient was informed that she still has 2 more years.  She is currently without complaints. She reports continued improvement in her vaginal bleeding in comparison to prior having the IUD. She denies pelvic pain or abnormal vaginal bleeding  No past medical history on file. No past surgical history on file. Family History  Problem Relation Age of Onset  . Fibroids Mother    Social History  Substance Use Topics  . Smoking status: Never Smoker   . Smokeless tobacco: Never Used  . Alcohol Use: No   ROS See pertinent in HPI  GENERAL: Well-developed, well-nourished female in no acute distress.  ABDOMEN: Soft, nontender, nondistended. No organomegaly. EXTREMITIES: No cyanosis, clubbing, or edema, 2+ distal pulses.  A/P 35 yo here for IUD removal Patient will return for IUD removal in 2 years or prn

## 2016-03-03 ENCOUNTER — Ambulatory Visit: Payer: BLUE CROSS/BLUE SHIELD | Admitting: Obstetrics and Gynecology

## 2017-08-05 ENCOUNTER — Other Ambulatory Visit: Payer: Self-pay

## 2017-08-05 ENCOUNTER — Emergency Department (HOSPITAL_COMMUNITY)
Admission: EM | Admit: 2017-08-05 | Discharge: 2017-08-06 | Disposition: A | Discharge status: Home / Self Care | Payer: BLUE CROSS/BLUE SHIELD | Attending: Emergency Medicine | Admitting: Emergency Medicine

## 2017-08-05 ENCOUNTER — Encounter (HOSPITAL_COMMUNITY): Payer: Self-pay | Admitting: Emergency Medicine

## 2017-08-05 DIAGNOSIS — Z79899 Other long term (current) drug therapy: Secondary | ICD-10-CM | POA: Insufficient documentation

## 2017-08-05 DIAGNOSIS — K0889 Other specified disorders of teeth and supporting structures: Secondary | ICD-10-CM | POA: Insufficient documentation

## 2017-08-05 DIAGNOSIS — K029 Dental caries, unspecified: Secondary | ICD-10-CM | POA: Insufficient documentation

## 2017-08-05 HISTORY — DX: Benign neoplasm of connective and other soft tissue, unspecified: D21.9

## 2017-08-05 NOTE — ED Triage Notes (Signed)
Pt is c/o toothache on the left top  Pt states she has had problems with this tooth for the past 2 years off and on  Pt states it started hurting yesterday morning  Has taken OTC medication without relief

## 2017-08-06 MED ORDER — ACETAMINOPHEN 500 MG PO TABS
1000.0000 mg | ORAL_TABLET | Freq: Once | ORAL | Status: AC
  Administered 2017-08-06: 1000 mg via ORAL
  Filled 2017-08-06: qty 2

## 2017-08-06 MED ORDER — TRAMADOL HCL 50 MG PO TABS: 50.0000 mg | ORAL_TABLET | Freq: Four times a day (QID) | ORAL | 0 refills | Status: AC | PRN

## 2017-08-06 MED ORDER — AMOXICILLIN 500 MG PO CAPS: 500.0000 mg | ORAL_CAPSULE | Freq: Three times a day (TID) | ORAL | 0 refills | Status: AC

## 2017-08-06 NOTE — Discharge Instructions (Signed)
You have a dental infection. It is very important that you get evaluated by a dentist as soon as possible. Call tomorrow to schedule an appointment. Ibuprofen and tylenol as needed for pain. Take your full course of antibiotics. Read the instructions below.  Eat a soft or liquid diet and rinse your mouth out after meals with warm water. You should see a dentist or return here at once if you have increased swelling, increased pain or uncontrolled bleeding from the site of your injury.  SEEK MEDICAL CARE IF:  You have increased pain not controlled with medicines.  You have swelling around your tooth, in your face or neck.  You have bleeding which starts, continues, or gets worse.  You have a fever >101 If you are unable to open your mouth

## 2017-08-06 NOTE — ED Provider Notes (Signed)
[facility] DEPT Provider Note   CSN: [id number] Arrival date & time: 08/05/17  2228     History   Chief Complaint Chief Complaint  Patient presents with  . Dental Pain    HPI Yesenia Holmes is a 36 y.o. female.  HPI 36 year old African-American no pertinent past medical history presents presents to the ED for evaluation of dental pain.  Patient states that she has a bad tooth for the past 2-3 years.  This pain comes and goes.  She has not followed up with a dentist.  Patient stated for the past 2 days the pain has worsened.  She reports significant dental decay.  Denies any associated difficulty breathing, difficulty swallowing, facial swelling, fevers.  Patient has been using over-the-counter dental relief with only little relief.  She did take ibuprofen today with only little relief.  Patient states the pain is worse with movement and palpation.  Nothing makes better.  Patient does not have a dentist and has not followed up with a dentist. Past Medical History:  Diagnosis Date  . Fibroids     Patient Active Problem List   Diagnosis Date Noted  . Fibroids 07/11/2011  . Anemia due to blood loss, chronic 07/11/2011    History reviewed. No pertinent surgical history.  OB History    Gravida Para Term Preterm AB Living   1       1 0   SAB TAB Ectopic Multiple Live Births     1             Home Medications    Prior to Admission medications   Medication Sig Start Date End Date Taking? Authorizing Provider  benzocaine (ORAJEL) 10 % mucosal gel Use as directed 1 application in the mouth or throat 4 (four) times daily as needed for mouth pain.   Yes [provider]  ibuprofen (ADVIL,MOTRIN) 400 MG tablet Take 400 mg by mouth every 6 (six) hours as needed (tooth pain).   Yes [provider]  acetaminophen (TYLENOL) 500 MG tablet Take 1,000 mg by mouth every 4 (four) hours as needed for mild pain.     [provider]    amoxicillin (AMOXIL) 500 MG capsule Take 1 capsule (500 mg total) by mouth 3 (three) times daily. 08/06/17   Doristine Devoid, PA-C  traMADol (ULTRAM) 50 MG tablet Take 1 tablet (50 mg total) by mouth every 6 (six) hours as needed. 08/06/17   Doristine Devoid, PA-C    Family History Family History  Problem Relation Age of Onset  . Fibroids Mother     Social History Social History   Tobacco Use  . Smoking status: Never Smoker  . Smokeless tobacco: Never Used  Substance Use Topics  . Alcohol use: No  . Drug use: No     Allergies   Tomato   Review of Systems Review of Systems  Constitutional: Negative for chills and fever.  HENT: Positive for dental problem. Negative for trouble swallowing.   Gastrointestinal: Negative for vomiting.  Skin: Negative for rash.     Physical Exam Updated Vital Signs BP 126/84 (BP Location: Left Arm)   Pulse 85   Temp 98.3 F (36.8 C) (Oral)   Resp 16   Ht 4\' 11"  (1.499 m)   Wt 44 kg (97 lb)   SpO2 98%   BMI 19.59 kg/m   Physical Exam  Constitutional: She appears well-developed and well-nourished. No distress.  HENT:  Head: Normocephalic and atraumatic.  Mouth/Throat:    No facial swelling.  Oropharynx is clear.  No sublingual or submandibular swelling.  Imaging secretions tolerating airway speaking since.  Eyes: Right eye exhibits no discharge. Left eye exhibits no discharge. No scleral icterus.  Neck: Normal range of motion. Neck supple.  Pulmonary/Chest: No respiratory distress.  Musculoskeletal: Normal range of motion.  Neurological: She is alert.  Skin: No pallor.  Psychiatric: Her behavior is normal. Judgment and thought content normal.  Nursing note and vitals reviewed.    ED Treatments / Results  Labs (all labs ordered are listed, but only abnormal results are displayed) Labs Reviewed - No data to display  EKG  EKG Interpretation None       Radiology No results found.  Procedures Procedures  (including critical care time)  Medications Ordered in ED Medications  acetaminophen (TYLENOL) tablet 1,000 mg (not administered)     Initial Impression / Assessment and Plan / ED Course  I have reviewed the triage vital signs and the nursing notes.  Pertinent labs & imaging results that were available during my care of the patient were reviewed by me and considered in my medical decision making (see chart for details).     Patient with toothache.  No gross abscess.  Exam unconcerning for Ludwig's angina or spread of infection.  Will treat with penicillin and pain medicine.  Urged patient to follow-up with dentist.     Final Clinical Impressions(s) / ED Diagnoses   Final diagnoses:  Pain, dental    ED Discharge Orders        Ordered    amoxicillin (AMOXIL) 500 MG capsule  3 times daily     08/06/17 0032    traMADol (ULTRAM) 50 MG tablet  Every 6 hours PRN     08/06/17 0032       Doristine Devoid, PA-C 08/06/17 0040    Veryl Speak, MD 08/06/17 [phone]

## 2019-03-19 ENCOUNTER — Other Ambulatory Visit: Payer: Self-pay | Admitting: Critical Care Medicine

## 2019-03-19 DIAGNOSIS — Z20822 Contact with and (suspected) exposure to covid-19: Secondary | ICD-10-CM

## 2019-03-23 LAB — NOVEL CORONAVIRUS, NAA: SARS-CoV-2, NAA: NOT DETECTED

## 2024-05-19 ENCOUNTER — Emergency Department (HOSPITAL_COMMUNITY)
Admission: EM | Admit: 2024-05-19 | Discharge: 2024-05-19 | Discharge status: Against Medical Advice | Payer: Self-pay | Attending: Emergency Medicine | Admitting: Emergency Medicine

## 2024-05-19 DIAGNOSIS — R109 Unspecified abdominal pain: Secondary | ICD-10-CM | POA: Insufficient documentation

## 2024-05-19 DIAGNOSIS — Z5321 Procedure and treatment not carried out due to patient leaving prior to being seen by health care provider: Secondary | ICD-10-CM | POA: Insufficient documentation

## 2024-05-19 NOTE — ED Notes (Signed)
 Pt leaving the ER AMA

## 2024-06-04 ENCOUNTER — Emergency Department (HOSPITAL_COMMUNITY)
Admission: EM | Admit: 2024-06-04 | Discharge: 2024-06-04 | Disposition: A | Discharge status: Home / Self Care | Payer: Self-pay | Attending: Emergency Medicine | Admitting: Emergency Medicine

## 2024-06-04 ENCOUNTER — Emergency Department (HOSPITAL_COMMUNITY): Payer: Self-pay

## 2024-06-04 ENCOUNTER — Other Ambulatory Visit: Payer: Self-pay

## 2024-06-04 ENCOUNTER — Encounter (HOSPITAL_COMMUNITY): Payer: Self-pay

## 2024-06-04 DIAGNOSIS — K5900 Constipation, unspecified: Secondary | ICD-10-CM | POA: Insufficient documentation

## 2024-06-04 DIAGNOSIS — D219 Benign neoplasm of connective and other soft tissue, unspecified: Secondary | ICD-10-CM | POA: Insufficient documentation

## 2024-06-04 LAB — COMPREHENSIVE METABOLIC PANEL WITH GFR
ALT: <5 U/L (ref 0–44)
AST: 17 U/L (ref 15–41)
Albumin: 4.1 g/dL (ref 3.5–5.0)
Alkaline Phosphatase: 44 U/L (ref 38–126)
Anion gap: 10 (ref 5–15)
BUN: 16 mg/dL (ref 6–20)
CO2: 23 mmol/L (ref 22–32)
Calcium: 9.8 mg/dL (ref 8.9–10.3)
Chloride: 110 mmol/L (ref 98–111)
Creatinine, Ser: 0.59 mg/dL (ref 0.44–1.00)
GFR, Estimated: >60 mL/min (ref >60)
Glucose, Bld: 100 mg/dL — ABNORMAL HIGH (ref 70–99)
Potassium: 3.6 mmol/L (ref 3.5–5.1)
Sodium: 143 mmol/L (ref 135–145)
Total Bilirubin: <0.2 mg/dL (ref 0.0–1.2)
Total Protein: 7.1 g/dL (ref 6.5–8.1)

## 2024-06-04 LAB — CBC
HCT: 31.1 % — ABNORMAL LOW (ref 36.0–46.0)
Hemoglobin: 9 g/dL — ABNORMAL LOW (ref 12.0–15.0)
MCH: 22.1 pg — ABNORMAL LOW (ref 26.0–34.0)
MCHC: 28.9 g/dL — ABNORMAL LOW (ref 30.0–36.0)
MCV: 76.4 fL — ABNORMAL LOW (ref 80.0–100.0)
Platelets: 407 K/uL — ABNORMAL HIGH (ref 150–400)
RBC: 4.07 MIL/uL (ref 3.87–5.11)
RDW: 16.1 % — ABNORMAL HIGH (ref 11.5–15.5)
WBC: 4.4 K/uL (ref 4.0–10.5)
nRBC: 0 % (ref 0.0–0.2)

## 2024-06-04 LAB — URINALYSIS, ROUTINE W REFLEX MICROSCOPIC
Bilirubin Urine: NEGATIVE
Glucose, UA: NEGATIVE mg/dL
Hgb urine dipstick: NEGATIVE
Ketones, ur: NEGATIVE mg/dL
Leukocytes,Ua: NEGATIVE
Nitrite: NEGATIVE
Protein, ur: NEGATIVE mg/dL
Specific Gravity, Urine: 1.03 (ref 1.005–1.030)
pH: 7 (ref 5.0–8.0)

## 2024-06-04 LAB — HCG, SERUM, QUALITATIVE: Preg, Serum: NEGATIVE

## 2024-06-04 LAB — LIPASE, BLOOD: Lipase: 20 U/L (ref 11–51)

## 2024-06-04 MED ORDER — ONDANSETRON 8 MG PO TBDP: 8.0000 mg | ORAL_TABLET | Freq: Three times a day (TID) | ORAL | 0 refills | Status: AC | PRN

## 2024-06-04 MED ORDER — LACTATED RINGERS IV BOLUS
1000.0000 mL | Freq: Once | INTRAVENOUS | Status: AC
  Administered 2024-06-04: 1000 mL via INTRAVENOUS

## 2024-06-04 MED ORDER — MORPHINE SULFATE (PF) 4 MG/ML IV SOLN
4.0000 mg | Freq: Once | INTRAVENOUS | Status: AC
  Administered 2024-06-04: 4 mg via INTRAVENOUS
  Filled 2024-06-04: qty 1

## 2024-06-04 MED ORDER — LACTATED RINGERS IV SOLN: INTRAVENOUS | Status: DC

## 2024-06-04 MED ORDER — IOHEXOL 300 MG/ML  SOLN
100.0000 mL | Freq: Once | INTRAMUSCULAR | Status: AC | PRN
  Administered 2024-06-04: 100 mL via INTRAVENOUS

## 2024-06-04 MED ORDER — ONDANSETRON HCL 4 MG/2ML IJ SOLN
4.0000 mg | Freq: Once | INTRAMUSCULAR | Status: DC
  Filled 2024-06-04: qty 2

## 2024-06-04 MED ORDER — ONDANSETRON HCL 4 MG/2ML IJ SOLN
4.0000 mg | Freq: Once | INTRAMUSCULAR | Status: AC
  Administered 2024-06-04: 4 mg via INTRAVENOUS
  Filled 2024-06-04: qty 2

## 2024-06-04 NOTE — ED Provider Notes (Signed)
 [place] EMERGENCY DEPARTMENT AT [facility] Provider Note   CSN: [id number] Arrival date & time: 06/04/24  2016     Patient presents with: Pelvic Pain   Yesenia Holmes is a 43 y.o. female.   43 year old female presents with several month history of abdominal pain for the past month and a half has had some abdominal swelling.  States that she was told that she has fibroids.  Denies any vaginal bleeding at this time.  Did start having emesis today which she describes as nonbilious or bloody.  No recent fever or chills.  No urinary symptoms.  Describes the pain as sharp and migrating down to her left lower quadrant.  Does not receive any regular women's health       Prior to Admission medications   Medication Sig Start Date End Date Taking? Authorizing Provider  acetaminophen  (TYLENOL ) 500 MG tablet Take 1,000 mg by mouth every 4 (four) hours as needed for mild pain.     [provider]  amoxicillin  (AMOXIL ) 500 MG capsule Take 1 capsule (500 mg total) by mouth 3 (three) times daily. 08/06/17   Annabell Vinie DASEN, PA-C  benzocaine (ORAJEL) 10 % mucosal gel Use as directed 1 application in the mouth or throat 4 (four) times daily as needed for mouth pain.    [provider]  ibuprofen (ADVIL,MOTRIN) 400 MG tablet Take 400 mg by mouth every 6 (six) hours as needed (tooth pain).    [provider]  traMADol  (ULTRAM ) 50 MG tablet Take 1 tablet (50 mg total) by mouth every 6 (six) hours as needed. 08/06/17   Annabell Vinie DASEN, PA-C    Allergies: Tomato    Review of Systems  All other systems reviewed and are negative.   Updated Vital Signs BP 139/68   Pulse 98   Temp 98.6 F (37 C) (Oral)   Resp 16   Ht 1.524 m (5')   SpO2 100%   BMI 18.94 kg/m   Physical Exam Vitals and nursing note reviewed.  Constitutional:      General: She is not in acute distress.    Appearance: Normal appearance. She is well-developed. She is not  toxic-appearing.  HENT:     Head: Normocephalic and atraumatic.  Eyes:     General: Lids are normal.     Conjunctiva/sclera: Conjunctivae normal.     Pupils: Pupils are equal, round, and reactive to light.  Neck:     Thyroid: No thyroid mass.     Trachea: No tracheal deviation.  Cardiovascular:     Rate and Rhythm: Normal rate and regular rhythm.     Heart sounds: Normal heart sounds. No murmur heard.    No gallop.  Pulmonary:     Effort: Pulmonary effort is normal. No respiratory distress.     Breath sounds: Normal breath sounds. No stridor. No decreased breath sounds, wheezing, rhonchi or rales.  Abdominal:     General: There is no distension.     Palpations: Abdomen is soft.     Tenderness: There is no abdominal tenderness. There is no rebound.      Comments: Mass is firm.  Musculoskeletal:        General: No tenderness. Normal range of motion.     Cervical back: Normal range of motion and neck supple.  Skin:    General: Skin is warm and dry.     Findings: No abrasion or rash.  Neurological:     Mental Status: She is  alert and oriented to person, place, and time. Mental status is at baseline.     GCS: GCS eye subscore is 4. GCS verbal subscore is 5. GCS motor subscore is 6.     Cranial Nerves: No cranial nerve deficit.     Sensory: No sensory deficit.     Motor: Motor function is intact.  Psychiatric:        Attention and Perception: Attention normal.        Speech: Speech normal.        Behavior: Behavior normal.     (all labs ordered are listed, but only abnormal results are displayed) Labs Reviewed  LIPASE, BLOOD  COMPREHENSIVE METABOLIC PANEL WITH GFR  CBC  URINALYSIS, ROUTINE W REFLEX MICROSCOPIC  HCG, SERUM, QUALITATIVE    EKG: None  Radiology: No results found.   Procedures   Medications Ordered in the ED  lactated ringers bolus 1,000 mL (has no administration in time range)  lactated ringers infusion (has no administration in time range)   morphine (PF) 4 MG/ML injection 4 mg (has no administration in time range)  ondansetron (ZOFRAN) injection 4 mg (has no administration in time range)                                    Medical Decision Making Amount and/or Complexity of Data Reviewed Labs: ordered. Radiology: ordered.  Risk Prescription drug management.   Patient not pregnant here.  Hemoglobin stable at 9.  Abdominal CT shows large fibroids.  Some mild hydro noted as well.  Patient also noted to have moderate colonic stool burden.  Will check urinalysis.  Discussed with gyn on call who reviewed the patient's films and stated that no acute intervention was needed.  They need to follow-up with women's clinic.     Final diagnoses:  None    ED Discharge Orders     None          Yesenia Faden, MD 06/04/24 2226

## 2024-06-04 NOTE — Discharge Instructions (Addendum)
 You were evaluated in the Emergency Department and after careful evaluation, we did not find any emergent condition requiring admission or further testing in the hospital.  Your exam/testing today was overall reassuring.  Your urine sample was normal.  Symptoms likely due to constipation or uterine fibroids.  Use over-the-counter stool softeners such as MiraLAX for the constipation.  Follow-up with a gynecologist.  Please return to the Emergency Department if you experience any worsening of your condition.  Thank you for allowing us  to be a part of your care.

## 2024-06-04 NOTE — ED Triage Notes (Signed)
 Pt presents via POV c/o abd pain for the past couple of months. Pt reports uterine fibroids. Reports N/V, denies diarrhea. Pt points to pelvis when asked where she is hurting.

## 2024-06-04 NOTE — ED Provider Notes (Signed)
  Provider Note MRN:  [id number]  Arrival date & time: 06/04/24    ED Course and Medical Decision Making  Assumed care of patient at sign-out or upon transfer.  Lower abdominal pain favored to be due to either fibroids or constipation based on CT scan.  Will need follow-up on urinalysis, anticipating discharge thereafter.  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 Constipation, unspecified constipation type  K59.00       ED Discharge Orders          Ordered    ondansetron (ZOFRAN-ODT) 8 MG disintegrating tablet  Every 8 hours PRN        06/04/24 2230              Discharge Instructions      You were evaluated in the Emergency Department and after careful evaluation, we did not find any emergent condition requiring admission or further testing in the hospital.  Your exam/testing today was overall reassuring.  Your urine sample was normal.  Symptoms likely due to constipation or uterine fibroids.  Use over-the-counter stool softeners such as MiraLAX for the constipation.  Follow-up with a gynecologist.  Please return to the Emergency Department if you experience any worsening of your condition.  Thank you for allowing us  to be a part of your care.     Ozell HERO. Theadore, MD [facility] Emergency Medicine [facility] [email or web address]    Theadore Ozell HERO, MD 06/04/24 [phone]
